# Patient Record
Sex: Male | Born: 2000 | Race: Black or African American | Hispanic: No | Marital: Single | Smoking: Never smoker
Health system: Southern US, Community
[De-identification: ages and names within clinical notes are randomized; demographics above are authoritative.]

---
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
## 2020-09-06 ENCOUNTER — Emergency Department (HOSPITAL_COMMUNITY)
Admission: EM | Admit: 2020-09-06 | Discharge: 2020-09-06 | Disposition: A | Discharge status: Home / Self Care | Payer: Medicaid Other | Attending: Emergency Medicine | Admitting: Emergency Medicine

## 2020-09-06 ENCOUNTER — Emergency Department (HOSPITAL_COMMUNITY): Payer: Medicaid Other

## 2020-09-06 ENCOUNTER — Other Ambulatory Visit: Payer: Self-pay

## 2020-09-06 ENCOUNTER — Encounter (HOSPITAL_COMMUNITY): Payer: Self-pay

## 2020-09-06 DIAGNOSIS — M25511 Pain in right shoulder: Secondary | ICD-10-CM

## 2020-09-06 DIAGNOSIS — X500XXA Overexertion from strenuous movement or load, initial encounter: Secondary | ICD-10-CM | POA: Insufficient documentation

## 2020-09-06 NOTE — ED Provider Notes (Signed)
[facility] EMERGENCY DEPARTMENT Provider Note   CSN: [id number] Arrival date & time: 09/06/20  1416     History No chief complaint on file.   Kyle Briggs is a 19 y.o. male.  19 year old male with prior medical history detailed below presents for evaluation of right shoulder pain.  Patient reports that he works at [employer].  He does significant heavy lifting every day.  He complains of mild diffuse anterior shoulder pain.  This began over the last week.  Pain is worse with movement or with heavy lifting.  He has not taken anything for this pain.  He has not seen anyone previously for this complaint.  He denies specific acute injury.  He denies fall.  He denies fever.  He is otherwise without complaint.  The history is provided by the patient and medical records.  Shoulder Pain Location:  Shoulder Shoulder location:  R shoulder Pain details:    Quality:  Aching   Radiates to:  R shoulder   Severity:  Mild   Onset quality:  Gradual   Duration:  1 week   Timing:  Constant   Progression:  Waxing and waning Handedness:  Right-handed Dislocation: no   Prior injury to area:  No Relieved by:  Being still Worsened by:  Movement Ineffective treatments:  None tried      History reviewed. No pertinent past medical history.  There are no problems to display for this patient.   History reviewed. No pertinent surgical history.     No family history on file.  Social History   Tobacco Use  . Smoking status: Never Smoker  Substance Use Topics  . Alcohol use: No  . Drug use: No    Home Medications Prior to Admission medications   Medication Sig Start Date End Date Taking? Authorizing Provider  acetaminophen-codeine 120-12 MG/5ML solution Take 5-10 mL by mouth every 6 (six) hours as needed for pain. 07/19/13   Mack Hook, MD    Allergies    Patient has no known allergies.  Review of Systems   Review of Systems  All other systems reviewed and are  negative.   Physical Exam Updated Vital Signs BP 120/77   Pulse 62   Temp 98.5 F (36.9 C) (Oral)   Resp 16   Ht 5\' 7"  (1.702 m)   Wt 59 kg   SpO2 98%   BMI 20.36 kg/m   Physical Exam Vitals and nursing note reviewed.  Constitutional:      General: He is not in acute distress.    Appearance: He is well-developed.  HENT:     Head: Normocephalic and atraumatic.  Eyes:     Conjunctiva/sclera: Conjunctivae normal.     Pupils: Pupils are equal, round, and reactive to light.  Cardiovascular:     Rate and Rhythm: Normal rate and regular rhythm.     Heart sounds: Normal heart sounds.  Pulmonary:     Effort: Pulmonary effort is normal. No respiratory distress.     Breath sounds: Normal breath sounds.  Abdominal:     General: There is no distension.     Palpations: Abdomen is soft.     Tenderness: There is no abdominal tenderness.  Musculoskeletal:        General: Tenderness present. No deformity. Normal range of motion.     Cervical back: Normal range of motion and neck supple.     Comments: Mild diffuse tenderness with palpation to the anterior superior aspect of the right  shoulder.  Distal right upper extremity is neurovascular intact.  Skin:    General: Skin is warm and dry.  Neurological:     Mental Status: He is alert and oriented to person, place, and time.     ED Results / Procedures / Treatments   Labs (all labs ordered are listed, but only abnormal results are displayed) Labs Reviewed - No data to display  EKG None  Radiology DG Shoulder Right  Result Date: 09/06/2020 CLINICAL DATA:  Pt c/o right shoulder pain x 1 day. No injury to the area. No hx of prior injuries or surgeries EXAM: RIGHT SHOULDER - 2+ VIEW COMPARISON:  None. FINDINGS: There is no evidence of fracture or dislocation. There is no evidence of arthropathy or other focal bone abnormality. Soft tissues are unremarkable. IMPRESSION: Negative. Electronically Signed   By: Tish Frederickson M.D.   On:  09/06/2020 15:37    Procedures Procedures (including critical care time)  Medications Ordered in ED Medications - No data to display  ED Course  I have reviewed the triage vital signs and the nursing notes.  Pertinent labs & imaging results that were available during my care of the patient were reviewed by me and considered in my medical decision making (see chart for details).    MDM Rules/Calculators/A&P                          MDM  Screen complete  Kyle Briggs was evaluated in Emergency Department on 09/06/2020 for the symptoms described in the history of present illness. He was evaluated in the context of the global COVID-19 pandemic, which necessitated consideration that the patient might be at risk for infection with the SARS-CoV-2 virus that causes COVID-19. Institutional protocols and algorithms that pertain to the evaluation of patients at risk for COVID-19 are in a state of rapid change based on information released by regulatory bodies including the CDC and federal and state organizations. These policies and algorithms were followed during the patient's care in the ED.  Patient is presenting for evaluation of reported right shoulder discomfort.  Patient's pain is likely related to his occupation -- he lifts heavy boxes every day at [employer].  Patient is x-ray is without evidence of acute pathology.  Patient does understand need for close orthopedic follow-up as an outpatient.  Importance of close follow-up is stressed.  Strict return precautions given and understood.   Final Clinical Impression(s) / ED Diagnoses Final diagnoses:  Acute pain of right shoulder    Rx / DC Orders ED Discharge Orders    None       Wynetta Fines, MD 09/06/20 [phone]

## 2020-09-06 NOTE — ED Notes (Signed)
Patient Alert and oriented to baseline. Stable and ambulatory to baseline. Patient verbalized understanding of the discharge instructions.  Patient belongings were taken by the patient.   

## 2020-09-06 NOTE — ED Triage Notes (Signed)
Patient complains of right shoulder pain x 5 days, denies known trauma but does a lot of lifting at [employer]

## 2020-09-06 NOTE — Discharge Instructions (Signed)
Please return for any problem.  Follow-up with orthopedics as instructed.  Avoid heavy lifting with the right shoulder.  Use sling for comfort.  Use ibuprofen - 600 mg taken every 8 hours - for treatment of pain.

## 2023-07-01 ENCOUNTER — Emergency Department (HOSPITAL_BASED_OUTPATIENT_CLINIC_OR_DEPARTMENT_OTHER): Payer: Medicaid Other

## 2023-07-01 ENCOUNTER — Emergency Department (HOSPITAL_BASED_OUTPATIENT_CLINIC_OR_DEPARTMENT_OTHER)
Admission: EM | Admit: 2023-07-01 | Discharge: 2023-07-01 | Disposition: A | Discharge status: Home / Self Care | Payer: Medicaid Other | Attending: Emergency Medicine | Admitting: Emergency Medicine

## 2023-07-01 ENCOUNTER — Encounter (HOSPITAL_BASED_OUTPATIENT_CLINIC_OR_DEPARTMENT_OTHER): Payer: Self-pay

## 2023-07-01 ENCOUNTER — Other Ambulatory Visit: Payer: Self-pay

## 2023-07-01 DIAGNOSIS — R079 Chest pain, unspecified: Secondary | ICD-10-CM

## 2023-07-01 LAB — LIPASE, BLOOD: Lipase: 28 U/L (ref 11–51)

## 2023-07-01 LAB — CBC
HCT: 45.7 % (ref 39.0–52.0)
Hemoglobin: 16.1 g/dL (ref 13.0–17.0)
MCH: 30 pg (ref 26.0–34.0)
MCHC: 35.2 g/dL (ref 30.0–36.0)
MCV: 85.3 fL (ref 80.0–100.0)
Platelets: 215 10*3/uL (ref 150–400)
RBC: 5.36 MIL/uL (ref 4.22–5.81)
RDW: 11.1 % — ABNORMAL LOW (ref 11.5–15.5)
WBC: 8 10*3/uL (ref 4.0–10.5)
nRBC: 0 % (ref 0.0–0.2)

## 2023-07-01 LAB — HEPATIC FUNCTION PANEL
ALT: 15 U/L (ref 0–44)
AST: 20 U/L (ref 15–41)
Albumin: 4.3 g/dL (ref 3.5–5.0)
Alkaline Phosphatase: 36 U/L — ABNORMAL LOW (ref 38–126)
Bilirubin, Direct: <0.1 mg/dL (ref 0.0–0.2)
Total Bilirubin: 0.5 mg/dL (ref 0.3–1.2)
Total Protein: 7.4 g/dL (ref 6.5–8.1)

## 2023-07-01 LAB — BASIC METABOLIC PANEL
Anion gap: 10 (ref 5–15)
BUN: 10 mg/dL (ref 6–20)
CO2: 28 mmol/L (ref 22–32)
Calcium: 9.5 mg/dL (ref 8.9–10.3)
Chloride: 98 mmol/L (ref 98–111)
Creatinine, Ser: 0.93 mg/dL (ref 0.61–1.24)
GFR, Estimated: >60 mL/min (ref >60)
Glucose, Bld: 104 mg/dL — ABNORMAL HIGH (ref 70–99)
Potassium: 3.2 mmol/L — ABNORMAL LOW (ref 3.5–5.1)
Sodium: 136 mmol/L (ref 135–145)

## 2023-07-01 LAB — TROPONIN I (HIGH SENSITIVITY)
Troponin I (High Sensitivity): 4 ng/L (ref <18)
Troponin I (High Sensitivity): 4 ng/L (ref <18)

## 2023-07-01 MED ORDER — ALUM & MAG HYDROXIDE-SIMETH 200-200-20 MG/5ML PO SUSP
30.0000 mL | Freq: Once | ORAL | Status: AC
  Administered 2023-07-01: 30 mL via ORAL
  Filled 2023-07-01: qty 30

## 2023-07-01 MED ORDER — POTASSIUM CHLORIDE CRYS ER 20 MEQ PO TBCR
40.0000 meq | EXTENDED_RELEASE_TABLET | Freq: Once | ORAL | Status: AC
  Administered 2023-07-01: 40 meq via ORAL
  Filled 2023-07-01: qty 2

## 2023-07-01 MED ORDER — ACETAMINOPHEN 500 MG PO TABS
1000.0000 mg | ORAL_TABLET | Freq: Once | ORAL | Status: AC
  Administered 2023-07-01: 1000 mg via ORAL
  Filled 2023-07-01: qty 2

## 2023-07-01 MED ORDER — CYCLOBENZAPRINE HCL 10 MG PO TABS: 10.0000 mg | ORAL_TABLET | Freq: Two times a day (BID) | ORAL | 0 refills | Status: AC | PRN

## 2023-07-01 MED ORDER — LIDOCAINE VISCOUS HCL 2 % MT SOLN
15.0000 mL | Freq: Once | OROMUCOSAL | Status: AC
  Administered 2023-07-01: 15 mL via ORAL
  Filled 2023-07-01: qty 15

## 2023-07-01 MED ORDER — PANTOPRAZOLE SODIUM 20 MG PO TBEC: 20.0000 mg | DELAYED_RELEASE_TABLET | Freq: Every day | ORAL | 0 refills | Status: AC

## 2023-07-01 NOTE — ED Provider Notes (Signed)
[place] [facility] Provider Note  CSN: [id number] Arrival date & time: 07/01/23 2024  Chief Complaint(s) Chest Pain  HPI Kyle Briggs is a 22 y.o. male with past medical history as below, significant for no significant medical history who presents to the ED with complaint of epigastric pain.  Patient reports acute onset of epigastric, lower chest pain while at work.  Described as sharp, stabbing, burning sensation.  Onset around 5 PM this evening.  Lasted for about 2 to 3 minutes and began to improve spontaneously.  Also reports that he felt nauseated and vomited once or twice.  He tried to eat something afterwards and it made the pain improved.  He still has some residual aching discomfort to the epigastrium, lower sternal area but greatly improved from the onset.  No current dyspnea, no history of DVT or PE, no recent diet or medication changes, no recent alcohol or illicit drug use.  No tobacco use.  Past Medical History History reviewed. No pertinent past medical history. There are no problems to display for this patient.  Home Medication(s) Prior to Admission medications   Medication Sig Start Date End Date Taking? Authorizing Provider  cyclobenzaprine (FLEXERIL) 10 MG tablet Take 1 tablet (10 mg total) by mouth 2 (two) times daily as needed for muscle spasms. 07/01/23  Yes Tanda Rockers A, DO  pantoprazole (PROTONIX) 20 MG tablet Take 1 tablet (20 mg total) by mouth daily for 14 days. 07/01/23 07/15/23 Yes Tanda Rockers A, DO  acetaminophen-codeine 120-12 MG/5ML solution Take 5-10 mL by mouth every 6 (six) hours as needed for pain. 07/19/13   Mack Hook, MD                                                                                                                                    Past Surgical History History reviewed. No pertinent surgical history. Family History History reviewed. No pertinent family history.  Social History Social  History   Tobacco Use   Smoking status: Never  Substance Use Topics   Alcohol use: No   Drug use: No   Allergies Patient has no known allergies.  Review of Systems Review of Systems  Constitutional:  Negative for chills and fever.  Respiratory:  Positive for chest tightness. Negative for wheezing.   Cardiovascular:  Positive for chest pain. Negative for leg swelling.  Gastrointestinal:  Positive for abdominal pain and nausea. Negative for vomiting.  Genitourinary:  Negative for urgency.  Musculoskeletal:  Negative for back pain.  Skin:  Negative for rash.  Neurological:  Negative for syncope and weakness.  All other systems reviewed and are negative.   Physical Exam Vital Signs  I have reviewed the triage vital signs BP 123/86   Pulse 79   Temp 97.7 F (36.5 C)   Resp 12   Ht 5\' 7"  (1.702 m)   Wt 54.4 kg   SpO2 97%  BMI 18.79 kg/m  Physical Exam Vitals and nursing note reviewed.  Constitutional:      General: He is not in acute distress.    Appearance: He is well-developed.  HENT:     Head: Normocephalic and atraumatic.     Right Ear: External ear normal.     Left Ear: External ear normal.     Mouth/Throat:     Mouth: Mucous membranes are moist.  Eyes:     General: No scleral icterus. Cardiovascular:     Rate and Rhythm: Normal rate and regular rhythm.     Pulses: Normal pulses.     Heart sounds: Normal heart sounds. No murmur heard.    No S3 sounds.  Pulmonary:     Effort: Pulmonary effort is normal. No respiratory distress.     Breath sounds: Normal breath sounds.  Abdominal:     General: Abdomen is flat.     Palpations: Abdomen is soft.     Tenderness: There is no abdominal tenderness. There is no guarding.  Musculoskeletal:     Cervical back: No rigidity.     Right lower leg: No edema.     Left lower leg: No edema.  Skin:    General: Skin is warm and dry.     Capillary Refill: Capillary refill takes less than 2 seconds.  Neurological:      Mental Status: He is alert and oriented to person, place, and time.     GCS: GCS eye subscore is 4. GCS verbal subscore is 5. GCS motor subscore is 6.  Psychiatric:        Mood and Affect: Mood normal.        Behavior: Behavior normal.     ED Results and Treatments Labs (all labs ordered are listed, but only abnormal results are displayed) Labs Reviewed  BASIC METABOLIC PANEL - Abnormal; Notable for the following components:      Result Value   Potassium 3.2 (*)    Glucose, Bld 104 (*)    All other components within normal limits  CBC - Abnormal; Notable for the following components:   RDW 11.1 (*)    All other components within normal limits  HEPATIC FUNCTION PANEL - Abnormal; Notable for the following components:   Alkaline Phosphatase 36 (*)    All other components within normal limits  LIPASE, BLOOD  TROPONIN I (HIGH SENSITIVITY)  TROPONIN I (HIGH SENSITIVITY)                                                                                                                          Radiology DG Chest 2 View  Result Date: 07/01/2023 CLINICAL DATA:  Chest pain. EXAM: CHEST - 2 VIEW COMPARISON:  None Available. FINDINGS: The cardiomediastinal contours are normal. The lungs are clear. Pulmonary vasculature is normal. No consolidation, pleural effusion, or pneumothorax. No acute osseous abnormalities are seen. Telemetry leads overlie the chest. IMPRESSION: Negative radiographs of the chest. Electronically Signed  By: Narda Rutherford M.D.   On: 07/01/2023 21:07    Pertinent labs & imaging results that were available during my care of the patient were reviewed by me and considered in my medical decision making (see MDM for details).  Medications Ordered in ED Medications  potassium chloride SA (KLOR-CON M) CR tablet 40 mEq (40 mEq Oral Given 07/01/23 2217)  acetaminophen (TYLENOL) tablet 1,000 mg (1,000 mg Oral Given 07/01/23 2231)  alum & mag hydroxide-simeth (MAALOX/MYLANTA)  200-200-20 MG/5ML suspension 30 mL (30 mLs Oral Given 07/01/23 2233)    And  lidocaine (XYLOCAINE) 2 % viscous mouth solution 15 mL (15 mLs Oral Given 07/01/23 2233)                                                                                                                                     Procedures Procedures  (including critical care time)  Medical Decision Making / ED Course    Medical Decision Making:    Kyle Briggs is a 22 y.o. male  with past medical history as below, significant for no significant medical history who presents to the ED with complaint of epigastric pain.. The complaint involves an extensive differential diagnosis and also carries with it a high risk of complications and morbidity.  Serious etiology was considered. Ddx includes but is not limited to: Differential diagnosis includes but is not exclusive to acute cholecystitis, intrathoracic causes for epigastric abdominal pain, gastritis, duodenitis, pancreatitis, small bowel or large bowel obstruction, abdominal aortic aneurysm, hernia, gastritis, etc. Differential includes all life-threatening causes for chest pain. This includes but is not exclusive to acute coronary syndrome, aortic dissection, pulmonary embolism, cardiac tamponade, community-acquired pneumonia, pericarditis, musculoskeletal chest wall pain, etc.   Complete initial physical exam performed, notably the patient  was no acute distress, HDS, abdomen nonperitoneal, no hypoxia.    Reviewed and confirmed nursing documentation for past medical history, family history, social history.  Vital signs reviewed.   PERC negative WELL's score is LOW HEART score is low  Potassium mildly depleted, replaced orally Symptoms resolved following GI cocktail Recommend bland diet Pcp f/u   The patient's chest pain is not suggestive of pulmonary embolus, cardiac ischemia, aortic dissection, pericarditis, myocarditis, pulmonary embolism, pneumothorax,  pneumonia, Zoster, or esophageal perforation, or other serious etiology.  Historically not abrupt in onset, tearing or ripping, pulses symmetric. EKG nonspecific for ischemia/infarction. No dysrhythmias, brugada, WPW, prolonged QT noted.   Troponin negative x2. CXR reviewed. Labs without demonstration of acute pathology unless otherwise noted above. Low HEART Score: 0-3 points (0.9-1.7% risk of MACE).  Given the extremely low risk of these diagnoses further testing and evaluation for these possibilities does not appear to be indicated at this time. Patient in no distress and overall condition improved here in the ED. Detailed discussions were had with the patient regarding current findings, and need for close f/u with PCP or on call doctor. The patient  has been instructed to return immediately if the symptoms worsen in any way for re-evaluation. Patient verbalized understanding and is in agreement with current care plan. All questions answered prior to discharge.           Additional history obtained: -Additional history obtained from spouse -External records from outside source obtained and reviewed including: Chart review including previous notes, labs, imaging, consultation notes including prior ED visits, primary care documentation, prior imaging   Lab Tests: -I ordered, reviewed, and interpreted labs.   The pertinent results include:   Labs Reviewed  BASIC METABOLIC PANEL - Abnormal; Notable for the following components:      Result Value   Potassium 3.2 (*)    Glucose, Bld 104 (*)    All other components within normal limits  CBC - Abnormal; Notable for the following components:   RDW 11.1 (*)    All other components within normal limits  HEPATIC FUNCTION PANEL - Abnormal; Notable for the following components:   Alkaline Phosphatase 36 (*)    All other components within normal limits  LIPASE, BLOOD  TROPONIN I (HIGH SENSITIVITY)  TROPONIN I (HIGH SENSITIVITY)    Notable  for potassium low, replaced orally  EKG   EKG Interpretation Date/Time:  Monday July 01 2023 20:33:05 EDT Ventricular Rate:  82 PR Interval:  149 QRS Duration:  86 QT Interval:  341 QTC Calculation: 399 R Axis:   95  Text Interpretation: Unknown rhythm, irregular rate Benign early repolarization No previous ECGs available Confirmed by Tanda Rockers ([id number]) on 07/01/2023 10:13:44 PM         Imaging Studies ordered: I ordered imaging studies including chest x-ray I independently visualized the following imaging with scope of interpretation limited to determining acute life threatening conditions related to emergency care; findings noted above, significant for unremarkable imaging I independently visualized and interpreted imaging. I agree with the radiologist interpretation   Medicines ordered and prescription drug management: Meds ordered this encounter  Medications   potassium chloride SA (KLOR-CON M) CR tablet 40 mEq   acetaminophen (TYLENOL) tablet 1,000 mg   AND Linked Order Group    alum & mag hydroxide-simeth (MAALOX/MYLANTA) 200-200-20 MG/5ML suspension 30 mL    lidocaine (XYLOCAINE) 2 % viscous mouth solution 15 mL   pantoprazole (PROTONIX) 20 MG tablet    Sig: Take 1 tablet (20 mg total) by mouth daily for 14 days.    Dispense:  14 tablet    Refill:  0   cyclobenzaprine (FLEXERIL) 10 MG tablet    Sig: Take 1 tablet (10 mg total) by mouth 2 (two) times daily as needed for muscle spasms.    Dispense:  14 tablet    Refill:  0    -I have reviewed the patients home medicines and have made adjustments as needed   Consultations Obtained: na   Cardiac Monitoring: The patient was maintained on a cardiac monitor.  I personally viewed and interpreted the cardiac monitored which showed an underlying rhythm of: NSR  Social Determinants of Health:  Diagnosis or treatment significantly limited by social determinants of health: no pcp   Reevaluation: After the  interventions noted above, I reevaluated the patient and found that they have improved  Co morbidities that complicate the patient evaluation History reviewed. No pertinent past medical history.    Dispostion: Disposition decision including need for hospitalization was considered, and patient discharged from emergency department.    Final Clinical Impression(s) / ED Diagnoses Final diagnoses:  Chest pain  with low risk for cardiac etiology        Sloan Leiter, DO 07/01/23 2324

## 2023-07-01 NOTE — ED Triage Notes (Signed)
Pt states that he was at work today at approx 1300 when he began having sharp intermittent midsternal chest pain that lasted approx 2-3 hours. Pt reports pain has since subsided. Pt does states that he had some SOB associated with the pain.

## 2023-07-01 NOTE — Discharge Instructions (Addendum)
Return to the Emergency Department if you have unusual chest pain, pressure, or discomfort, shortness of breath, nausea, vomiting, burping, heartburn, tingling upper body parts, sweating, cold, clammy skin, or racing heartbeat. Call 911 if you think you are having a heart attack. Take all cardiac medications as prescribed - notify your doctor if you have any side effects. Follow cardiac diet - avoid fatty & fried foods, don't eat too much red meat, eat lots of fruits & vegetables, and dairy products should be low fat. Please lose weight if you are overweight. Become more active with walking, gardening, or any other activity that gets you to moving.   Please return to the emergency department immediately for any new or concerning symptoms, or if you get worse. 

## 2023-11-03 ENCOUNTER — Emergency Department (HOSPITAL_BASED_OUTPATIENT_CLINIC_OR_DEPARTMENT_OTHER)
Admission: EM | Admit: 2023-11-03 | Discharge: 2023-11-03 | Disposition: A | Discharge status: Home / Self Care | Payer: Medicaid Other | Attending: Emergency Medicine | Admitting: Emergency Medicine

## 2023-11-03 ENCOUNTER — Encounter (HOSPITAL_BASED_OUTPATIENT_CLINIC_OR_DEPARTMENT_OTHER): Payer: Self-pay

## 2023-11-03 ENCOUNTER — Other Ambulatory Visit: Payer: Self-pay

## 2023-11-03 ENCOUNTER — Emergency Department (HOSPITAL_BASED_OUTPATIENT_CLINIC_OR_DEPARTMENT_OTHER): Payer: Medicaid Other

## 2023-11-03 DIAGNOSIS — R059 Cough, unspecified: Secondary | ICD-10-CM | POA: Insufficient documentation

## 2023-11-03 DIAGNOSIS — R0981 Nasal congestion: Secondary | ICD-10-CM | POA: Diagnosis not present

## 2023-11-03 DIAGNOSIS — R051 Acute cough: Secondary | ICD-10-CM

## 2023-11-03 DIAGNOSIS — Z1152 Encounter for screening for COVID-19: Secondary | ICD-10-CM | POA: Diagnosis not present

## 2023-11-03 DIAGNOSIS — R0789 Other chest pain: Secondary | ICD-10-CM | POA: Insufficient documentation

## 2023-11-03 LAB — I-STAT CHEM 8, ED
BUN: 8 mg/dL (ref 6–20)
Calcium, Ion: 1.18 mmol/L (ref 1.15–1.40)
Chloride: 101 mmol/L (ref 98–111)
Creatinine, Ser: 0.9 mg/dL (ref 0.61–1.24)
Glucose, Bld: 90 mg/dL (ref 70–99)
HCT: 51 % (ref 39.0–52.0)
Hemoglobin: 17.3 g/dL — ABNORMAL HIGH (ref 13.0–17.0)
Potassium: 3.9 mmol/L (ref 3.5–5.1)
Sodium: 138 mmol/L (ref 135–145)
TCO2: 25 mmol/L (ref 22–32)

## 2023-11-03 LAB — CBC WITH DIFFERENTIAL/PLATELET
Abs Immature Granulocytes: 0.02 10*3/uL (ref 0.00–0.07)
Basophils Absolute: 0 10*3/uL (ref 0.0–0.1)
Basophils Relative: 0 %
Eosinophils Absolute: 0.1 10*3/uL (ref 0.0–0.5)
Eosinophils Relative: 1 %
HCT: 49.3 % (ref 39.0–52.0)
Hemoglobin: 17.2 g/dL — ABNORMAL HIGH (ref 13.0–17.0)
Immature Granulocytes: 0 %
Lymphocytes Relative: 17 %
Lymphs Abs: 1.2 10*3/uL (ref 0.7–4.0)
MCH: 29.9 pg (ref 26.0–34.0)
MCHC: 34.9 g/dL (ref 30.0–36.0)
MCV: 85.7 fL (ref 80.0–100.0)
Monocytes Absolute: 0.5 10*3/uL (ref 0.1–1.0)
Monocytes Relative: 7 %
Neutro Abs: 5.4 10*3/uL (ref 1.7–7.7)
Neutrophils Relative %: 75 %
Platelets: 240 10*3/uL (ref 150–400)
RBC: 5.75 MIL/uL (ref 4.22–5.81)
RDW: 11.2 % — ABNORMAL LOW (ref 11.5–15.5)
WBC: 7.2 10*3/uL (ref 4.0–10.5)
nRBC: 0 % (ref 0.0–0.2)

## 2023-11-03 LAB — GROUP A STREP BY PCR: Group A Strep by PCR: NOT DETECTED

## 2023-11-03 LAB — RESP PANEL BY RT-PCR (RSV, FLU A&B, COVID)  RVPGX2
Influenza A by PCR: NEGATIVE
Influenza B by PCR: NEGATIVE
Resp Syncytial Virus by PCR: NEGATIVE
SARS Coronavirus 2 by RT PCR: NEGATIVE

## 2023-11-03 LAB — TROPONIN I (HIGH SENSITIVITY)
Troponin I (High Sensitivity): 4 ng/L (ref <18)
Troponin I (High Sensitivity): 4 ng/L (ref <18)

## 2023-11-03 MED ORDER — AMOXICILLIN-POT CLAVULANATE 875-125 MG PO TABS: 1.0000 | ORAL_TABLET | Freq: Two times a day (BID) | ORAL | 0 refills | Status: AC

## 2023-11-03 MED ORDER — BENZONATATE 100 MG PO CAPS: 100.0000 mg | ORAL_CAPSULE | Freq: Three times a day (TID) | ORAL | 0 refills | Status: AC

## 2023-11-03 NOTE — ED Triage Notes (Signed)
The patient has had a sore throat and cough for three days. No fever.

## 2023-11-03 NOTE — ED Notes (Signed)
Pt complaining of sudden sharp pain in L side of chest, stated pain came and went within a few seconds, EDP notified

## 2023-11-03 NOTE — ED Provider Notes (Cosign Needed Addendum)
[place] [facility] Provider Note   CSN: [id number] Arrival date & time: 11/03/23  6578     History  Chief Complaint  Patient presents with   Cough   Sore Throat    Kyle Briggs is a 22 y.o. male who presents with 3 days of productive cough, sore throat and congestion.  He states his daughter has COVID and RSV.  He denies any abdominal pain, chest pain or shortness of breath.  No diarrhea.  He did have 1 episode of emesis at the onset of his symptoms.   Cough Sore Throat       Home Medications Prior to Admission medications   Medication Sig Start Date End Date Taking? Authorizing Provider  amoxicillin-clavulanate (AUGMENTIN) 875-125 MG tablet Take 1 tablet by mouth every 12 (twelve) hours. 11/03/23  Yes Halford Decamp, PA-C  benzonatate (TESSALON) 100 MG capsule Take 1 capsule (100 mg total) by mouth every 8 (eight) hours. 11/03/23  Yes Halford Decamp, PA-C  acetaminophen-codeine 120-12 MG/5ML solution Take 5-10 mL by mouth every 6 (six) hours as needed for pain. 07/19/13   Mack Hook, MD  cyclobenzaprine (FLEXERIL) 10 MG tablet Take 1 tablet (10 mg total) by mouth 2 (two) times daily as needed for muscle spasms. 07/01/23   Sloan Leiter, DO  pantoprazole (PROTONIX) 20 MG tablet Take 1 tablet (20 mg total) by mouth daily for 14 days. 07/01/23 07/15/23  Sloan Leiter, DO      Allergies    Patient has no known allergies.    Review of Systems   Review of Systems  Respiratory:  Positive for cough.     Physical Exam Updated Vital Signs BP 126/79   Pulse 83   Temp 98.8 F (37.1 C) (Oral)   Resp 16   Ht 5\' 7"  (1.702 m)   Wt 54 kg   SpO2 99%   BMI 18.65 kg/m  Physical Exam Vitals and nursing note reviewed.  Constitutional:      General: He is not in acute distress.    Appearance: He is well-developed.  HENT:     Head: Normocephalic and atraumatic.     Nose: Congestion present.     Mouth/Throat:     Pharynx: Uvula  midline. No oropharyngeal exudate or posterior oropharyngeal erythema.     Tonsils: No tonsillar exudate or tonsillar abscesses.  Eyes:     Conjunctiva/sclera: Conjunctivae normal.  Cardiovascular:     Rate and Rhythm: Normal rate and regular rhythm.     Heart sounds: No murmur heard. Pulmonary:     Effort: Pulmonary effort is normal. No respiratory distress.     Breath sounds: Normal breath sounds.  Abdominal:     Palpations: Abdomen is soft.     Tenderness: There is no abdominal tenderness.  Musculoskeletal:        General: No swelling.     Cervical back: Neck supple.  Skin:    General: Skin is warm and dry.     Capillary Refill: Capillary refill takes less than 2 seconds.  Neurological:     Mental Status: He is alert.  Psychiatric:        Mood and Affect: Mood normal.     ED Results / Procedures / Treatments   Labs (all labs ordered are listed, but only abnormal results are displayed) Labs Reviewed  CBC WITH DIFFERENTIAL/PLATELET - Abnormal; Notable for the following components:      Result Value   Hemoglobin  17.2 (*)    RDW 11.2 (*)    All other components within normal limits  I-STAT CHEM 8, ED - Abnormal; Notable for the following components:   Hemoglobin 17.3 (*)    All other components within normal limits  RESP PANEL BY RT-PCR (RSV, FLU A&B, COVID)  [id number]  GROUP A STREP BY PCR  TROPONIN I (HIGH SENSITIVITY)  TROPONIN I (HIGH SENSITIVITY)    EKG EKG Interpretation Date/Time:  Sunday November 03 2023 11:21:18 EST Ventricular Rate:  65 PR Interval:  151 QRS Duration:  82 QT Interval:  344 QTC Calculation: 358 R Axis:   98  Text Interpretation: Sinus rhythm ST elevation c/w benign early repolarization no significant change since July 2024 Confirmed by Pricilla Loveless [phone]) on 11/03/2023 11:25:06 AM  Radiology DG Chest 2 View  Result Date: 11/03/2023 CLINICAL DATA:  Three day history of cough, sore throat EXAM: CHEST - 2 VIEW COMPARISON:  Chest  radiograph dated 07/01/2023 FINDINGS: Normal lung volumes. Rounded opacity projecting over the left lower lobe. No pleural effusion or pneumothorax. The heart size and mediastinal contours are within normal limits. No acute osseous abnormality. IMPRESSION: Rounded opacity projecting over the left lower lobe, which may represent a focus of pneumonia in the acute setting. Recommend follow-up chest radiographs in 6-8 weeks to ensure resolution. Electronically Signed   By: Agustin Cree M.D.   On: 11/03/2023 11:45    Procedures Procedures    Medications Ordered in ED Medications - No data to display  ED Course/ Medical Decision Making/ A&P Clinical Course as of 11/03/23 1431  Sun Nov 03, 2023  1411 Troponin I (High Sensitivity) [JT]    Clinical Course User Index [JT] Halford Decamp, PA-C                                 Medical Decision Making Amount and/or Complexity of Data Reviewed Labs: ordered. Decision-making details documented in ED Course. Radiology: ordered.  Risk Prescription drug management.   This patient presents to the ED with chief complaint(s) of cough with no pertinent past medical history.  The complaint involves an extensive differential diagnosis and also carries with it a high risk of complications and morbidity.    The differential diagnosis includes  Pneumonia, viral URI, sinus infection, strep The initial plan is to  Will obtain strep and respiratory panel Additional history obtained: No additional historians or records utilized Initial Assessment:   Patient is overall well-appearing.  Hemodynamically stable, afebrile.  Lung sounds clear.  No mucosal or tonsil exudate.  With with recent viral sick contacts suspect similar etiology. Independent ECG interpretation:  NSR with early repol, no ischemic changes  Independent labs interpretation:  The following labs were independently interpreted:  Strep negative  respiratory panel Troponins remain flat Cbc  and Istat WNL  Independent visualization and interpretation of imaging: CXR demonstrate opacity in left lower lobe consistent with pneumonia   Treatment and Reassessment: Upon reassessment at 11 AM, patient mentions that he had hemoptysis last night.  This is described as minimal specks.  Additionally he is now complaining of sudden left-sided chest pain that came and went.  Will add on chest x-ray and cardiac workup.  Symptoms are infectious.  He has no PE risk factors overall low suspicion for clot.  He does have some reproducible left-sided chest tenderness.  No rash.  Suspect musculoskeletal etiology.  Consultations obtained:   None  Disposition:  Patient will be discharged on course of Augmentin for what appears to be a pneumonia on x-ray.  Cardiac workup unremarkable. Hypotosis is minimal and resolved, suspect secondary to cough.  The patient has been appropriately medically screened and/or stabilized in the ED. I have low suspicion for any other emergent medical condition which would require further screening, evaluation or treatment in the ED or require inpatient management. At time of discharge the patient is hemodynamically stable and in no acute distress. I have discussed work-up results and diagnosis with patient and answered all questions. Patient is agreeable with discharge plan. We discussed strict return precautions for returning to the emergency department and they verbalized understanding.      Social Determinants of Health:   None  This note was dictated with voice recognition software.  Despite best efforts at proofreading, errors may have occurred which can change the documentation meaning.          Final Clinical Impression(s) / ED Diagnoses Final diagnoses:  Acute cough  Atypical chest pain    Rx / DC Orders ED Discharge Orders          Ordered    benzonatate (TESSALON) 100 MG capsule  Every 8 hours        11/03/23 1423    amoxicillin-clavulanate  (AUGMENTIN) 875-125 MG tablet  Every 12 hours        11/03/23 1423              Fabienne Bruns 11/03/23 1432    Pricilla Loveless, MD 11/03/23 1441    Halford Decamp, PA-C 11/03/23 1529    Pricilla Loveless, MD 11/06/23 [phone]

## 2023-11-03 NOTE — Discharge Instructions (Addendum)
It was a pleasure taking care of you this morning.  You were evaluated in the emergency room for cough and sore throat.  A chest x-ray was performed which showed a possible pneumonia.  An antibiotic and antibiotic and a cough medication was sent into your pharmacy.  Please be sure to complete the full course of these antibiotics.  Additionally because of the coughing up blood and sudden chest pain that he experienced here we evaluated your heart.  An EKG and blood work were overall normal.  If you experience any new or worsening symptoms including shortness of breath, fevers, progressive cough please return to the emergency room.

## 2023-11-11 ENCOUNTER — Other Ambulatory Visit: Payer: Self-pay

## 2023-11-11 ENCOUNTER — Encounter (HOSPITAL_BASED_OUTPATIENT_CLINIC_OR_DEPARTMENT_OTHER): Payer: Self-pay

## 2023-11-11 ENCOUNTER — Emergency Department (HOSPITAL_BASED_OUTPATIENT_CLINIC_OR_DEPARTMENT_OTHER)
Admission: EM | Admit: 2023-11-11 | Discharge: 2023-11-11 | Disposition: A | Discharge status: Home / Self Care | Payer: Medicaid Other | Attending: Emergency Medicine | Admitting: Emergency Medicine

## 2023-11-11 DIAGNOSIS — Z09 Encounter for follow-up examination after completed treatment for conditions other than malignant neoplasm: Secondary | ICD-10-CM

## 2023-11-11 DIAGNOSIS — Z113 Encounter for screening for infections with a predominantly sexual mode of transmission: Secondary | ICD-10-CM | POA: Insufficient documentation

## 2023-11-11 DIAGNOSIS — R059 Cough, unspecified: Secondary | ICD-10-CM | POA: Insufficient documentation

## 2023-11-11 NOTE — Discharge Instructions (Signed)
The results of your STD testing for chlamydia and gonorrhea should be available within 1 week.  You can check your patient portal for daily result updates.  If you test positive for either infection he will likely get a phone call from the hospital directing you to treatment.  Please avoid any form of sex until you have confirmed the results.  If you are positive for any type of sexual infection, you will need to complete a course of antibiotics, and you will need to let your prior sexual partners know to get tested and treated.

## 2023-11-11 NOTE — ED Provider Notes (Signed)
[place] [facility] Provider Note   CSN: [id number] Arrival date & time: 11/11/23  1022     History  Chief Complaint  Patient presents with   Pneumonia    Kyle Briggs is a 22 y.o. male presenting to the emergency department with request for a follow-up check.  The patient was diagnosed and treated with pneumonia approximately 9 days ago with a left lower lobe infiltrate noted on his x-ray.  He has completed a full course of Augmentin 2 days ago.  He reports improvement of his general symptoms including shortness of breath and coughing, still has a mild lingering cough.  Denies any ongoing fevers or chills.  He says he feels back to normal.  He is separately requesting for STI testing.  He denies any new high risk exposures but says he has not been tested in some time would like another screening test.  He is only interested and Chlamydia and gonorrhea testing.  He denies penile drainage.  HPI     Home Medications Prior to Admission medications   Medication Sig Start Date End Date Taking? Authorizing Provider  acetaminophen-codeine 120-12 MG/5ML solution Take 5-10 mL by mouth every 6 (six) hours as needed for pain. 07/19/13   Mack Hook, MD  amoxicillin-clavulanate (AUGMENTIN) 875-125 MG tablet Take 1 tablet by mouth every 12 (twelve) hours. 11/03/23   Halford Decamp, PA-C  benzonatate (TESSALON) 100 MG capsule Take 1 capsule (100 mg total) by mouth every 8 (eight) hours. 11/03/23   Halford Decamp, PA-C  cyclobenzaprine (FLEXERIL) 10 MG tablet Take 1 tablet (10 mg total) by mouth 2 (two) times daily as needed for muscle spasms. 07/01/23   Sloan Leiter, DO  pantoprazole (PROTONIX) 20 MG tablet Take 1 tablet (20 mg total) by mouth daily for 14 days. 07/01/23 07/15/23  Sloan Leiter, DO      Allergies    Patient has no known allergies.    Review of Systems   Review of Systems  Physical Exam Updated Vital Signs BP 124/65 (BP  Location: Right Arm)   Pulse (!) 102   Temp 98.2 F (36.8 C) (Oral)   Resp 18   Ht 5\' 7"  (1.702 m)   Wt 54.4 kg   SpO2 97%   BMI 18.79 kg/m  Physical Exam Constitutional:      General: He is not in acute distress. HENT:     Head: Normocephalic and atraumatic.  Eyes:     Conjunctiva/sclera: Conjunctivae normal.     Pupils: Pupils are equal, round, and reactive to light.  Cardiovascular:     Rate and Rhythm: Normal rate and regular rhythm.  Pulmonary:     Effort: Pulmonary effort is normal. No respiratory distress.     Breath sounds: Normal breath sounds. No wheezing or rhonchi.  Abdominal:     General: There is no distension.     Tenderness: There is no abdominal tenderness.  Skin:    General: Skin is warm and dry.  Neurological:     General: No focal deficit present.     Mental Status: He is alert. Mental status is at baseline.  Psychiatric:        Mood and Affect: Mood normal.        Behavior: Behavior normal.     ED Results / Procedures / Treatments   Labs (all labs ordered are listed, but only abnormal results are displayed) Labs Reviewed  GC/CHLAMYDIA PROBE AMP ([place]) NOT  AT [facility]    EKG None  Radiology No results found.  Procedures Procedures    Medications Ordered in ED Medications - No data to display  ED Course/ Medical Decision Making/ A&P                                 Medical Decision Making  This is a well-appearing patient here for a follow-up visit after completion of treatment for community pneumonia with 7 days of Augmentin.  Clinically he is well-appearing.  He is not hypoxic.  Lungs are clear to auscultation.  I suspect he appropriately completed treatment with Augmentin.  There is no indication for a repeat x-ray at this time, and I explained to him that it can take 6 to 8 weeks to show resolution on an xray for a prior infection.  No further antibiotics are indicated at this time.  We can send a chlamydia gonorrhea urine test  but he does not have any high risk exposures to warrant an empiric treatment.  He is stable for discharge.        Final Clinical Impression(s) / ED Diagnoses Final diagnoses:  Follow-up exam  Encounter for screening examination for sexually transmitted disease    Rx / DC Orders ED Discharge Orders     None         Khushboo Chuck, Kermit Balo, MD 11/11/23 [phone]

## 2023-11-11 NOTE — ED Triage Notes (Signed)
Patient here POV from Home.  Endorses being seen in ED about a week ago for Cough and Congestion. Diagnosed with PNA and finished antibiotic therapy 2 days ago.   States his cough as improved but still notes congestion as well. States he would like reevaluation to see if he still has PNA. Also would like STI testing as well.   NAD Noted during Triage. A&Ox4. GCS 15. Ambulatory.

## 2023-11-12 LAB — GC/CHLAMYDIA PROBE AMP (~~LOC~~) NOT AT ARMC
Chlamydia: NEGATIVE
Comment: NEGATIVE
Comment: NORMAL
Neisseria Gonorrhea: NEGATIVE
